# Patient Record
Sex: Female | Born: 1960 | Race: White | Hispanic: No | Marital: Married
Health system: Southern US, Community
[De-identification: ages and names within clinical notes are randomized; demographics above are authoritative.]

---

## 2006-05-26 ENCOUNTER — Emergency Department (HOSPITAL_COMMUNITY): Admission: EM | Admit: 2006-05-26 | Discharge: 2006-05-26 | Payer: Self-pay | Admitting: Emergency Medicine

## 2006-07-22 ENCOUNTER — Ambulatory Visit: Payer: Self-pay | Admitting: Physician Assistant

## 2007-05-23 ENCOUNTER — Emergency Department: Payer: Self-pay | Admitting: Emergency Medicine

## 2010-11-20 ENCOUNTER — Ambulatory Visit: Payer: Self-pay

## 2010-12-09 ENCOUNTER — Ambulatory Visit: Payer: Self-pay

## 2011-01-08 ENCOUNTER — Ambulatory Visit: Payer: Self-pay | Admitting: Family Medicine

## 2012-08-14 ENCOUNTER — Emergency Department: Payer: Self-pay | Admitting: Internal Medicine

## 2013-01-28 ENCOUNTER — Ambulatory Visit: Payer: Self-pay | Admitting: Physician Assistant

## 2013-02-07 ENCOUNTER — Ambulatory Visit: Payer: Self-pay

## 2014-08-16 ENCOUNTER — Other Ambulatory Visit: Payer: Self-pay | Admitting: Oncology

## 2014-08-16 ENCOUNTER — Ambulatory Visit
Admission: RE | Admit: 2014-08-16 | Discharge: 2014-08-16 | Disposition: A | Discharge status: Home / Self Care | Payer: Self-pay | Source: Ambulatory Visit | Attending: Oncology | Admitting: Oncology

## 2014-08-16 ENCOUNTER — Ambulatory Visit: Payer: Self-pay | Attending: Oncology

## 2014-08-16 VITALS — BP 109/76 | HR 65 | Temp 96.2°F | Resp 20 | Ht 66.14 in | Wt 190.9 lb

## 2014-08-16 DIAGNOSIS — Z0000 Encounter for general adult medical examination without abnormal findings: Secondary | ICD-10-CM

## 2014-08-16 NOTE — Progress Notes (Signed)
Subjective:     Patient ID: Debbie Cortez, female   DOB: [birth year], 54 y.o.   MRN: [id number]  HPI   Review of Systems     Objective:   Physical Exam  Pulmonary/Chest: Right breast exhibits no inverted nipple, no mass, no nipple discharge, no skin change and no tenderness. Left breast exhibits no inverted nipple, no mass, no nipple discharge, no skin change and no tenderness. Breasts are asymmetrical.  Right breast larger than left breast.  Genitourinary: No labial fusion. There is no rash, tenderness, lesion or injury on the right labia. There is no rash, tenderness, lesion or injury on the left labia. There is tenderness in the vagina. No erythema or bleeding in the vagina. No foreign body around the vagina. No signs of injury around the vagina. No vaginal discharge found.       Assessment:    . 54 year old Patient presents for [facility] clinic visit.  Patient screened, and meets [facility] eligibility.  Patient does not have insurance, Medicare or Medicaid.  Handout given on Affordable Care Act.  CBE unremarkable  Instructed patient on breast self-exam using teach back method Pelvic exam normal.     Plan:     .Sent for bilateral screening mammogra.  Specimen collected for pap using small speculum.

## 2014-08-18 LAB — PAP LB AND HPV HIGH-RISK
HPV, high-risk: NEGATIVE
PAP Smear Comment: 0

## 2014-08-23 NOTE — Progress Notes (Signed)
Phoned patient to notify of normal mammogram, and pap smear results. +Patient to return in one year for annual screening.  Copy to [facility].

## 2015-12-24 ENCOUNTER — Ambulatory Visit: Payer: Self-pay

## 2015-12-26 ENCOUNTER — Ambulatory Visit: Payer: Self-pay

## 2016-09-25 ENCOUNTER — Other Ambulatory Visit: Payer: Self-pay | Admitting: Family Medicine

## 2016-09-25 DIAGNOSIS — Z1231 Encounter for screening mammogram for malignant neoplasm of breast: Secondary | ICD-10-CM

## 2016-10-22 ENCOUNTER — Ambulatory Visit
Admission: RE | Admit: 2016-10-22 | Discharge: 2016-10-22 | Disposition: A | Discharge status: Home / Self Care | Payer: BLUE CROSS/BLUE SHIELD | Source: Ambulatory Visit | Attending: Family Medicine | Admitting: Family Medicine

## 2016-10-22 DIAGNOSIS — Z1231 Encounter for screening mammogram for malignant neoplasm of breast: Secondary | ICD-10-CM | POA: Insufficient documentation

## 2016-12-12 ENCOUNTER — Other Ambulatory Visit: Payer: Self-pay | Admitting: Orthopedic Surgery

## 2016-12-12 DIAGNOSIS — M5440 Lumbago with sciatica, unspecified side: Secondary | ICD-10-CM

## 2016-12-18 ENCOUNTER — Ambulatory Visit
Admission: RE | Admit: 2016-12-18 | Discharge: 2016-12-18 | Disposition: A | Discharge status: Home / Self Care | Payer: BLUE CROSS/BLUE SHIELD | Source: Ambulatory Visit | Attending: Orthopedic Surgery | Admitting: Orthopedic Surgery

## 2016-12-18 DIAGNOSIS — M5137 Other intervertebral disc degeneration, lumbosacral region: Secondary | ICD-10-CM | POA: Insufficient documentation

## 2016-12-18 DIAGNOSIS — M5136 Other intervertebral disc degeneration, lumbar region: Secondary | ICD-10-CM | POA: Insufficient documentation

## 2016-12-18 DIAGNOSIS — M47816 Spondylosis without myelopathy or radiculopathy, lumbar region: Secondary | ICD-10-CM | POA: Diagnosis not present

## 2016-12-18 DIAGNOSIS — M5440 Lumbago with sciatica, unspecified side: Secondary | ICD-10-CM | POA: Diagnosis present

## 2017-11-23 ENCOUNTER — Other Ambulatory Visit: Payer: Self-pay | Admitting: Family Medicine

## 2018-01-27 ENCOUNTER — Ambulatory Visit: Payer: BLUE CROSS/BLUE SHIELD

## 2018-02-03 ENCOUNTER — Ambulatory Visit: Payer: Self-pay

## 2018-05-12 ENCOUNTER — Ambulatory Visit: Payer: Self-pay

## 2018-11-22 ENCOUNTER — Telehealth: Payer: Self-pay

## 2018-11-22 NOTE — Telephone Encounter (Signed)
Pre-visit screening call attempted prior to [facility] appointment on 11/24/2018. No answer / left msg / no call back necessary.

## 2018-11-23 ENCOUNTER — Other Ambulatory Visit: Payer: Self-pay

## 2018-11-24 ENCOUNTER — Encounter: Payer: Self-pay | Admitting: *Deleted

## 2018-11-24 ENCOUNTER — Other Ambulatory Visit: Payer: Self-pay

## 2018-11-24 ENCOUNTER — Ambulatory Visit: Payer: Self-pay | Attending: Oncology | Admitting: *Deleted

## 2018-11-24 ENCOUNTER — Ambulatory Visit
Admission: RE | Admit: 2018-11-24 | Discharge: 2018-11-24 | Disposition: A | Discharge status: Home / Self Care | Payer: Self-pay | Source: Ambulatory Visit | Attending: Oncology | Admitting: Oncology

## 2018-11-24 VITALS — BP 124/58 | HR 69 | Temp 97.7°F | Ht 66.0 in | Wt 201.0 lb

## 2018-11-24 DIAGNOSIS — Z0000 Encounter for general adult medical examination without abnormal findings: Secondary | ICD-10-CM

## 2018-11-24 DIAGNOSIS — Z1231 Encounter for screening mammogram for malignant neoplasm of breast: Secondary | ICD-10-CM | POA: Insufficient documentation

## 2018-11-24 NOTE — Progress Notes (Signed)
  Subjective:     Patient ID: Debbie Cortez, female   DOB: February 09, 1961, 58 y.o.   MRN: [id number]  HPI   Review of Systems     Objective:   Physical Exam Chest:     Breasts: Breasts are asymmetrical.        Right: Tenderness present. No swelling, bleeding, inverted nipple, mass, nipple discharge or skin change.        Left: Tenderness present. No swelling, bleeding, inverted nipple, mass, nipple discharge or skin change.       Comments: Right breast larger than the left Lymphadenopathy:     Upper Body:     Right upper body: No supraclavicular or axillary adenopathy.     Left upper body: No supraclavicular or axillary adenopathy.        Assessment:     58 year old White female returns to [facility] for annual screening.  Clinical breast exam with tenderness noted at bilateral lower outer quadrants.  Patient does states she drinks a lot of caffeine.  Encouraged patient to decrease caffeine intake. She is unsure about her menopausal status.  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  States maybe once a year she may have some spotting.  Encouraged patient to follow up with her primary care provider.  States she is unemployed at this time and is trying to "figure things out".  Recommended the [facility] for care since they accept sliding scale fees.  Patient has been screened for eligibility.  She does not have any insurance, Medicare or Medicaid.  She also meets financial eligibility.  Hand-out given on the Affordable Care Act. Risk Assessment    Risk Scores      11/24/2018   Last edited by: Theodore Demark, RN   5-year risk: 1.4 %   Lifetime risk: 8.9 %            Plan:     Screening mammogram ordered.  Will follow up per [facility] protocol.

## 2018-11-24 NOTE — Patient Instructions (Signed)
Gave patient hand-out, Women Staying Healthy, Active and Well from [facility], with education on breast health, pap smears, heart and colon health. 

## 2018-12-03 ENCOUNTER — Encounter: Payer: Self-pay | Admitting: *Deleted

## 2018-12-03 NOTE — Progress Notes (Signed)
Letter mailed from the [facility] to inform patient of her normal mammogram results.  Patient is to follow-up with annual screening in one year.  [facility] to Christy. 

## 2019-07-12 ENCOUNTER — Ambulatory Visit: Payer: Self-pay | Attending: Internal Medicine

## 2019-07-12 DIAGNOSIS — Z23 Encounter for immunization: Secondary | ICD-10-CM

## 2019-07-12 NOTE — Progress Notes (Signed)
   Covid-19 Vaccination Clinic  Name:  [name]    MRN: [id number] DOB: 1960-06-09  07/12/2019  Debbie Cortez was observed post Covid-19 immunization for 15 minutes without incident. She was provided with Vaccine Information Sheet and instruction to access the V-Safe system.   [name] was instructed to call 911 with any severe reactions post vaccine: Marland Kitchen Difficulty breathing  . Swelling of face and throat  . A fast heartbeat  . A bad rash all over body  . Dizziness and weakness   Immunizations Administered    Name Date Dose VIS Date Route   Pfizer COVID-19 Vaccine 07/12/2019 12:42 PM 0.3 mL 04/20/2018 Intramuscular   Manufacturer: [employer], [employer]   Lot: [id number]   NDC: [address]-9

## 2019-08-02 ENCOUNTER — Ambulatory Visit: Payer: Self-pay

## 2019-08-16 ENCOUNTER — Ambulatory Visit: Payer: Self-pay

## 2020-04-13 ENCOUNTER — Other Ambulatory Visit: Payer: Self-pay | Admitting: Family Medicine

## 2020-04-13 DIAGNOSIS — Z1231 Encounter for screening mammogram for malignant neoplasm of breast: Secondary | ICD-10-CM

## 2020-06-13 ENCOUNTER — Other Ambulatory Visit: Payer: Self-pay

## 2020-06-13 ENCOUNTER — Ambulatory Visit
Admission: RE | Admit: 2020-06-13 | Discharge: 2020-06-13 | Disposition: A | Discharge status: Home / Self Care | Payer: Self-pay | Source: Ambulatory Visit | Attending: Oncology | Admitting: Oncology

## 2020-06-13 ENCOUNTER — Encounter: Payer: Self-pay | Admitting: *Deleted

## 2020-06-13 ENCOUNTER — Ambulatory Visit: Payer: Self-pay | Attending: Oncology | Admitting: *Deleted

## 2020-06-13 VITALS — BP 134/83 | HR 67 | Temp 97.1°F | Ht 66.0 in | Wt 201.1 lb

## 2020-06-13 DIAGNOSIS — Z0000 Encounter for general adult medical examination without abnormal findings: Secondary | ICD-10-CM | POA: Insufficient documentation

## 2020-06-13 NOTE — Progress Notes (Signed)
  Subjective:     Patient ID: Debbie Cortez, female   DOB: [birth year], 60 y.o.   MRN: [id number]  HPI  .[facility]   Review of Systems     Objective:   Physical Exam Chest:  Breasts:     Right: Tenderness present. No swelling, bleeding, inverted nipple, mass, nipple discharge, skin change, axillary adenopathy or supraclavicular adenopathy.     Left: Tenderness present. No swelling, bleeding, inverted nipple, mass, nipple discharge, skin change, axillary adenopathy or supraclavicular adenopathy.      Comments: Patient complains of bilateral tenderness on exam Abdominal:     Palpations: There is no hepatomegaly or splenomegaly.  Genitourinary:    Exam position: Lithotomy position.     Labia:        Right: No rash, tenderness, lesion or injury.        Left: No rash, tenderness, lesion or injury.      Urethra: No prolapse or urethral pain.     Vagina: No signs of injury and foreign body. Tenderness present. No vaginal discharge, erythema, bleeding or lesions.     Cervix: No cervical motion tenderness, discharge, friability, lesion, erythema or eversion.     Uterus: Not deviated.      Adnexa:        Right: No mass.         Left: No mass.       Comments: Patient very uncomfortable on pelvic exam and states "it feels like there is no room"  "it feels very full"  Rectocele noted. Lymphadenopathy:     Upper Body:     Right upper body: No supraclavicular or axillary adenopathy.     Left upper body: No supraclavicular or axillary adenopathy.        Assessment:     60 year old female returns to [facility] for annual screening.  Clinical breast exam with tenderness at bilateral outer quadrants on exam.  Right breast 1-2 cups larger than the left. Taught self breast awareness.  Very difficult pelvic exam due to the patient's inability to tolerate the exam.  Patient experienced extreme discomfort and stated she felt "full".  Unable to palpate the cervix due to extreme pain.  Specimen for pap  collected with difficulty.  Patient states she is being referred to urogynecology for her bladder.  I encouraged her to discuss the "fullness" she is feeling on exam with the gynecologist.  Patient has been screened for eligibility.  She does not have any insurance, Medicare or Medicaid.  She also meets financial eligibility.   Risk Assessment    Risk Scores      06/13/2020 11/24/2018   Last edited by: Scarlett Presto, RN Scarlett Presto, RN   5-year risk: 1.4 % 1.4 %   Lifetime risk: 8.7 % 8.9 %            Plan:     Screening mammogram ordered.  Specimen for pap sent to the lab.

## 2020-06-15 LAB — IGP, APTIMA HPV: HPV Aptima: NEGATIVE

## 2020-06-20 ENCOUNTER — Encounter: Payer: Self-pay | Admitting: *Deleted

## 2020-06-20 NOTE — Progress Notes (Signed)
Letter mailed to inform patient of her normal mammogram and pap smear.  Next mammo due in one year and pap due in 5 years.

## 2022-08-28 IMAGING — MG MM DIGITAL SCREENING BILAT W/ TOMO AND CAD
6 of 10 series · 6 of 30 positions shown · non-contrast
Comparison: Previous exam(s).

CLINICAL DATA: Screening.

EXAM:
DIGITAL SCREENING BILATERAL MAMMOGRAM WITH TOMOSYNTHESIS AND CAD
TECHNIQUE: Bilateral screening digital craniocaudal and mediolateral oblique
mammograms were obtained. Bilateral screening digital breast
tomosynthesis was performed. The images were evaluated with
computer-aided detection.

[R XCCL synth-2D]
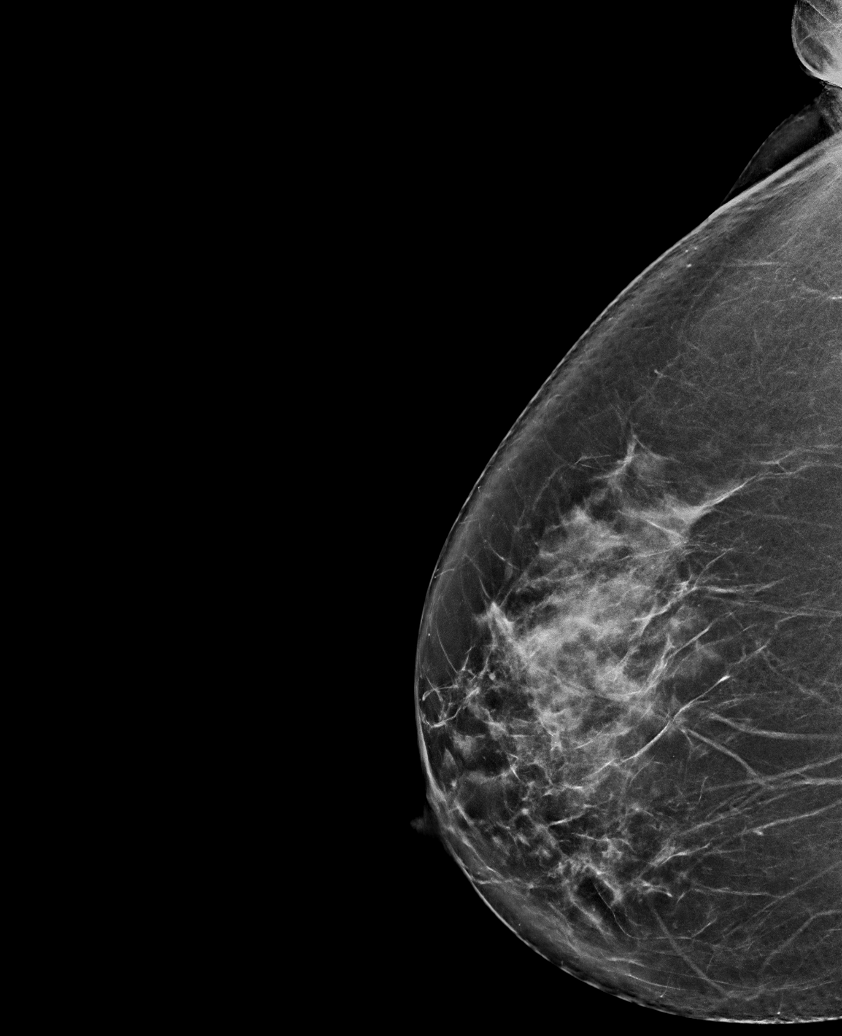

[L MLO synth-2D]
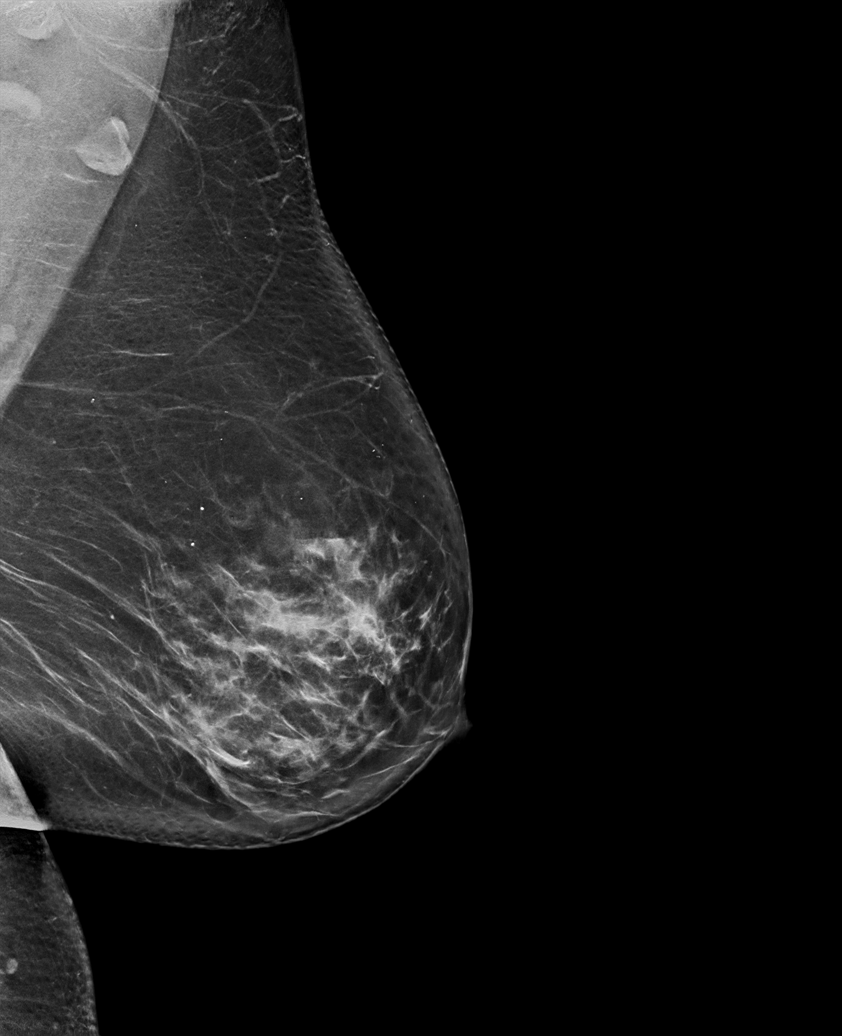

[R CC synth-2D]
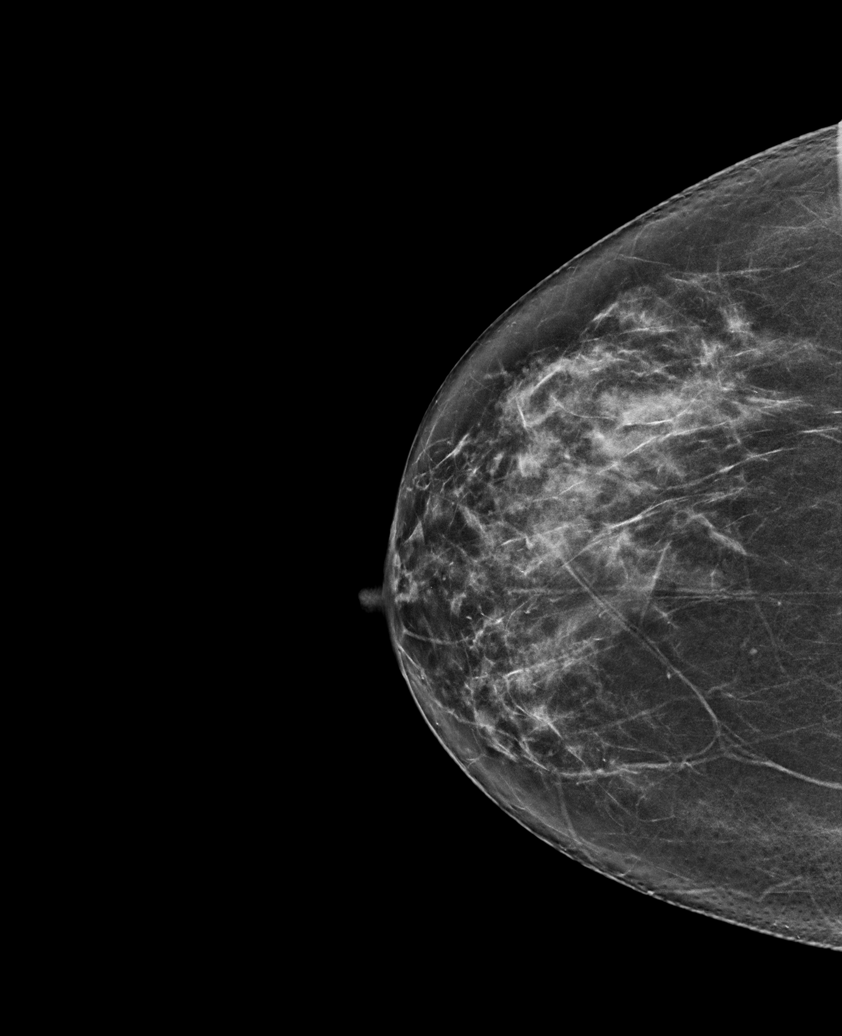

[R MLO synth-2D]
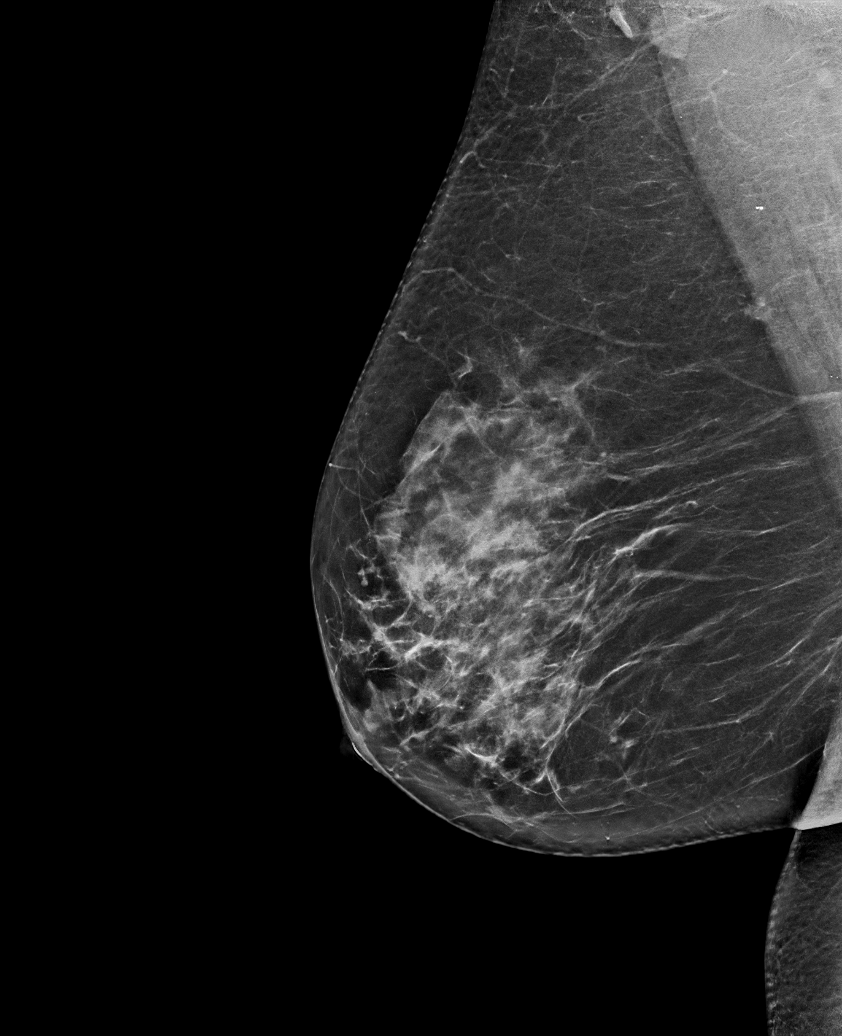

[L CC synth-2D]
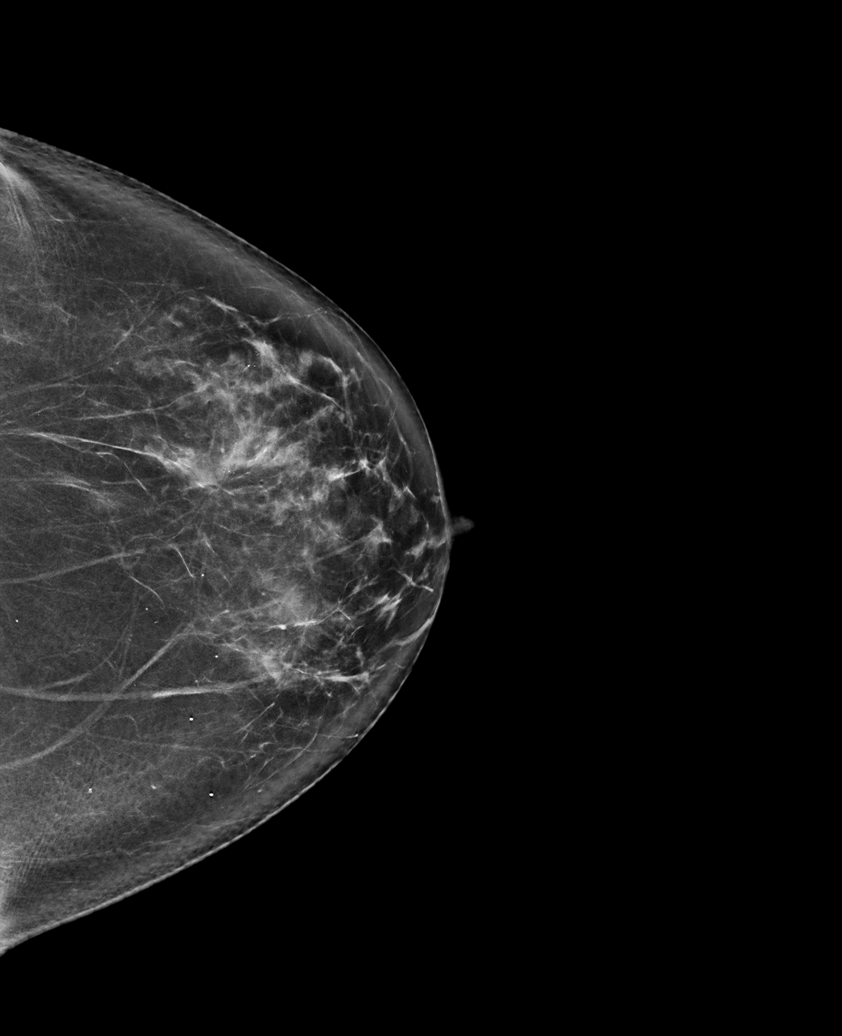

[R MLO tomo · tomo slice 41/81.0]
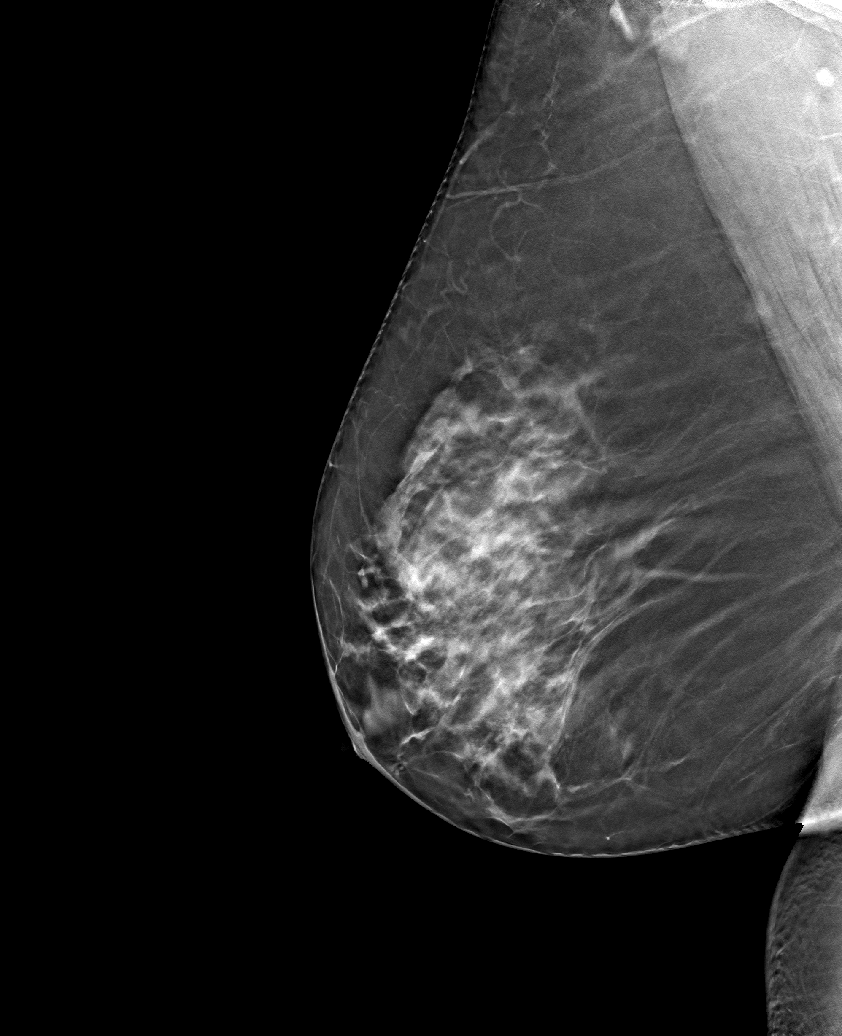

[6 of 30 positions shown; findings below may reference images not displayed]

ACR Breast Density Category c: The breast tissue is heterogeneously
dense, which may obscure small masses.
FINDINGS: There are no findings suspicious for malignancy. The images were
evaluated with computer-aided detection.
IMPRESSION: No mammographic evidence of malignancy. A result letter of this
screening mammogram will be mailed directly to the patient.

RECOMMENDATION:
Screening mammogram in one year. (Code:T4-5-GWO)

BI-RADS CATEGORY  1: Negative.
